# Patient Record
Sex: Male | Born: 1980 | Race: Black or African American | Hispanic: No | Marital: Single | Smoking: Never smoker
Health system: Southern US, Community
[De-identification: ages and names within clinical notes are randomized; demographics above are authoritative.]

## PROBLEM LIST (undated history)

## (undated) DIAGNOSIS — J45909 Unspecified asthma, uncomplicated: Secondary | ICD-10-CM

## (undated) DIAGNOSIS — S069XAA Unspecified intracranial injury with loss of consciousness status unknown, initial encounter: Secondary | ICD-10-CM

## (undated) DIAGNOSIS — S069X9A Unspecified intracranial injury with loss of consciousness of unspecified duration, initial encounter: Secondary | ICD-10-CM

---

## 2017-04-15 ENCOUNTER — Other Ambulatory Visit: Payer: Self-pay

## 2017-04-15 ENCOUNTER — Encounter (HOSPITAL_COMMUNITY): Payer: Self-pay | Admitting: Emergency Medicine

## 2017-04-15 ENCOUNTER — Emergency Department (HOSPITAL_COMMUNITY): Payer: PRIVATE HEALTH INSURANCE

## 2017-04-15 ENCOUNTER — Emergency Department (HOSPITAL_COMMUNITY)
Admission: EM | Admit: 2017-04-15 | Discharge: 2017-04-15 | Disposition: A | Discharge status: Home / Self Care | Payer: PRIVATE HEALTH INSURANCE | Attending: Emergency Medicine | Admitting: Emergency Medicine

## 2017-04-15 DIAGNOSIS — R05 Cough: Secondary | ICD-10-CM | POA: Diagnosis present

## 2017-04-15 DIAGNOSIS — J40 Bronchitis, not specified as acute or chronic: Secondary | ICD-10-CM

## 2017-04-15 DIAGNOSIS — R0789 Other chest pain: Secondary | ICD-10-CM | POA: Diagnosis not present

## 2017-04-15 DIAGNOSIS — Z87820 Personal history of traumatic brain injury: Secondary | ICD-10-CM | POA: Diagnosis not present

## 2017-04-15 HISTORY — DX: Unspecified intracranial injury with loss of consciousness status unknown, initial encounter: S06.9XAA

## 2017-04-15 HISTORY — DX: Unspecified asthma, uncomplicated: J45.909

## 2017-04-15 HISTORY — DX: Unspecified intracranial injury with loss of consciousness of unspecified duration, initial encounter: S06.9X9A

## 2017-04-15 LAB — BASIC METABOLIC PANEL
ANION GAP: 9 (ref 5–15)
BUN: 15 mg/dL (ref 6–20)
CHLORIDE: 101 mmol/L (ref 101–111)
CO2: 27 mmol/L (ref 22–32)
Calcium: 9.4 mg/dL (ref 8.9–10.3)
Creatinine, Ser: 0.84 mg/dL (ref 0.61–1.24)
GFR calc Af Amer: >60 mL/min (ref >60)
GLUCOSE: 108 mg/dL — AB (ref 65–99)
POTASSIUM: 3.9 mmol/L (ref 3.5–5.1)
Sodium: 137 mmol/L (ref 135–145)

## 2017-04-15 LAB — CBC
HEMATOCRIT: 43.2 % (ref 39.0–52.0)
HEMOGLOBIN: 15 g/dL (ref 13.0–17.0)
MCH: 31.3 pg (ref 26.0–34.0)
MCHC: 34.7 g/dL (ref 30.0–36.0)
MCV: 90.2 fL (ref 78.0–100.0)
Platelets: 259 10*3/uL (ref 150–400)
RBC: 4.79 MIL/uL (ref 4.22–5.81)
RDW: 13.7 % (ref 11.5–15.5)
WBC: 6.3 10*3/uL (ref 4.0–10.5)

## 2017-04-15 LAB — I-STAT TROPONIN, ED
Troponin i, poc: 0 ng/mL (ref 0.00–0.08)
Troponin i, poc: 0 ng/mL (ref 0.00–0.08)

## 2017-04-15 MED ORDER — ALBUTEROL SULFATE HFA 108 (90 BASE) MCG/ACT IN AERS: 1.0000 | INHALATION_SPRAY | Freq: Once | RESPIRATORY_TRACT | Status: DC

## 2017-04-15 MED ORDER — ALBUTEROL SULFATE HFA 108 (90 BASE) MCG/ACT IN AERS: 1.0000 | INHALATION_SPRAY | Freq: Four times a day (QID) | RESPIRATORY_TRACT | 0 refills | Status: AC | PRN

## 2017-04-15 MED ORDER — BENZONATATE 100 MG PO CAPS: 100.0000 mg | ORAL_CAPSULE | Freq: Three times a day (TID) | ORAL | 0 refills | Status: DC

## 2017-04-15 MED ORDER — IPRATROPIUM-ALBUTEROL 0.5-2.5 (3) MG/3ML IN SOLN
3.0000 mL | Freq: Once | RESPIRATORY_TRACT | Status: AC
  Administered 2017-04-15: 3 mL via RESPIRATORY_TRACT
  Filled 2017-04-15: qty 3

## 2017-04-15 MED ORDER — PREDNISONE 20 MG PO TABS: 40.0000 mg | ORAL_TABLET | Freq: Every day | ORAL | 0 refills | Status: DC

## 2017-04-15 MED ORDER — PREDNISONE 20 MG PO TABS
40.0000 mg | ORAL_TABLET | Freq: Once | ORAL | Status: AC
  Administered 2017-04-15: 40 mg via ORAL
  Filled 2017-04-15: qty 2

## 2017-04-15 MED ORDER — BENZONATATE 100 MG PO CAPS
100.0000 mg | ORAL_CAPSULE | Freq: Once | ORAL | Status: AC
  Administered 2017-04-15: 100 mg via ORAL
  Filled 2017-04-15: qty 1

## 2017-04-15 MED ORDER — IBUPROFEN 200 MG PO TABS
600.0000 mg | ORAL_TABLET | Freq: Once | ORAL | Status: AC
  Administered 2017-04-15: 600 mg via ORAL
  Filled 2017-04-15: qty 3

## 2017-04-15 NOTE — Discharge Instructions (Signed)
Your workup including lab work and imaging has been very reassuring.  You likely have a viral bronchitis.  Given your history of asthma have given you an inhaler to use.  Tessalon as needed for cough.  Take the prednisone starting tomorrow for the next 3 days.  Motrin and Tylenol at home for pain and fevers.  Please keep a close eye on your blood pressure.  Check this periodically.  Will need follow-up with a primary care doctor in the future if your blood pressures remain high for possible control.  If you develop any worsening chest pain, worsening shortness of breath, fevers or worsening productive cough return to the ED for further evaluation.

## 2017-04-15 NOTE — ED Triage Notes (Signed)
Pt reports having shortness of breath and chest pain for the last few days. Pt states pain in chest is sharp. Pt also reports productive cough for the last 4 days.

## 2017-04-15 NOTE — ED Notes (Signed)
Sa02 96% Lying/resting. Sa02 97% walking 60 seconds

## 2017-04-15 NOTE — ED Notes (Signed)
Sarah RN reported off Pt refused IV start.

## 2017-04-15 NOTE — ED Provider Notes (Signed)
[facility]-EMERGENCY DEPT Provider Note   CSN: [id number] Arrival date & time: 04/15/17  0524     History   Chief Complaint Chief Complaint  Patient presents with  . Shortness of Breath  . Chest Pain    HPI Darren Andrews is a 36 y.o. male.  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  The patient has a history of asthma.  States that for the past few days he has had a productive cough of yellow-green sputum.  Patient also states that over the past 2-3 days after started coughing he was having intermittent shortness of breath and chest wall pain with cough.  Patient describes the chest pain is substernal that is worse with cough and radiates across his chest.  Denies any associated diaphoresis, nausea, emesis.  Patient has no cardiac history.  Patient reports intermittent wheezing.  Denies any exertional chest pain or pleuritic chest pain.  Nothing makes his symptoms better.  He is tried over-the-counter NyQuil with only little relief.  Patient reports associated nasal congestion, rhinorrhea, sore throat.  Denies any associated fever, chills.  Patient denies any significant cardiac history or significant family cardiac history.  Denies any history of DVT/PE, prolonged immobilization, recent hospitalization/surgeries, unilateral leg swelling, hemoptysis, tobacco use.  Patient [place]  He does not have a PCP.  States that he ran out of his inhaler.  States this feels like his bronchitis or asthma exacerbation in the past.  Pt denies any fever, chill, ha, vision changes, lightheadedness, dizziness, congestion, neck pain, abd pain, n/v/d, urinary symptoms, change in bowel habits, melena, hematochezia, lower extremity  paresthesias. s Past Medical History:  Diagnosis Date  . Asthma   . TBI (traumatic brain injury) (HCC)     There are no active problems to display for this patient.   History reviewed. No pertinent surgical history.     Home Medications    Prior to Admission medications   Medication Sig Start Date End Date Taking? Authorizing Provider  naproxen sodium (ALEVE) 220 MG tablet Take 220 mg daily as needed by mouth (pain).   Yes [provider]  Phenyleph-Doxylamine-DM-APAP (NYQUIL SEVERE COLD/FLU) 5-6.25-10-325 MG/15ML LIQD Take 30 mLs as needed by mouth (cough).   Yes [provider]    Family History History reviewed. No pertinent family history.  Social History Social History   Tobacco Use  . Smoking status: Never Smoker  . Smokeless tobacco: Never Used  Substance Use Topics  . Alcohol use: No    Frequency: Never  . Drug use: No     Allergies   Patient has no known allergies.   Review of Systems Review of Systems  Constitutional: Negative for chills, diaphoresis and fever.  HENT: Positive for congestion, postnasal drip, rhinorrhea and sore throat.   Eyes: Negative for visual disturbance.  Respiratory: Positive for cough, chest tightness, shortness of breath and wheezing.   Cardiovascular: Positive for chest pain (chest wall). Negative for palpitations and leg swelling.  Gastrointestinal: Negative for abdominal pain, diarrhea, nausea and vomiting.  Genitourinary: Negative for dysuria, flank pain, frequency, hematuria and urgency.  Musculoskeletal: Negative for arthralgias and myalgias.  Skin: Negative for rash.  Neurological: Negative for dizziness, syncope, weakness, light-headedness, numbness and headaches.  Psychiatric/Behavioral: Negative for sleep disturbance. The patient is not nervous/anxious.  Physical Exam Updated Vital Signs BP (!) 142/86 (BP Location: Left Arm)   Pulse 88   Temp 97.9 F (36.6 C) (Oral)   Resp 18   Ht 5'  6" (1.676 m)   Wt (!) 158.8 kg (350 lb)   SpO2 100%   BMI 56.49 kg/m   Physical Exam  Constitutional: He is oriented to person, place, and time. He appears well-developed and well-nourished.  Non-toxic appearance. No distress.  HENT:  Head: Normocephalic and atraumatic.  Nose: Nose normal.  Mouth/Throat: Oropharynx is clear and moist.  Eyes: Conjunctivae are normal. Pupils are equal, round, and reactive to light. Right eye exhibits no discharge. Left eye exhibits no discharge.  Neck: Normal range of motion. Neck supple. No JVD present. No tracheal deviation present.  Cardiovascular: Normal rate, regular rhythm, normal heart sounds and intact distal pulses. Exam reveals no gallop and no friction rub.  No murmur heard. Pulmonary/Chest: Effort normal. No stridor. No respiratory distress. He has wheezes (scattered expiratory). He has no rales. He exhibits tenderness (mild anterior).  No hypoxia or tachypnea.  Abdominal: Soft. Bowel sounds are normal. He exhibits no distension. There is no tenderness. There is no rigidity, no rebound, no guarding, no CVA tenderness, no tenderness at McBurney's point and negative Murphy's sign.  Musculoskeletal: Normal range of motion.  No lower extremity edema or calf tenderness.  Lymphadenopathy:    He has no cervical adenopathy.  Neurological: He is alert and oriented to person, place, and time.  Skin: Skin is warm and dry. Capillary refill takes less than 2 seconds. He is not diaphoretic.  Psychiatric: His behavior is normal. Judgment and thought content normal.  Nursing note and vitals reviewed.    ED Treatments / Results  Labs (all labs ordered are listed, but only abnormal results are displayed) Labs Reviewed  BASIC METABOLIC PANEL - Abnormal; Notable for the following components:      Result Value   Glucose, Bld 108 (*)    All other components within normal limits  CBC  I-STAT TROPONIN, ED    EKG  EKG Interpretation None        Radiology Dg Chest 2 View  Result Date: 04/15/2017 CLINICAL DATA:  Acute onset chest pain and shortness of breath this morning. History of hypertension. EXAM: CHEST  2 VIEW COMPARISON:  None. FINDINGS: Cardiomediastinal silhouette is normal. No pleural effusions or focal consolidations. Bronchitic changes. Trachea projects midline and there is no pneumothorax. Soft tissue planes and included osseous structures are non-suspicious. Large body habitus. IMPRESSION: Bronchitic changes without focal consolidation. Electronically Signed   By: Awilda Metroourtnay  Bloomer M.D.   On: 04/15/2017 06:05    Procedures Procedures (including critical care time)  Medications Ordered in ED Medications  ipratropium-albuterol (DUONEB) 0.5-2.5 (3) MG/3ML nebulizer solution 3 mL (not administered)  predniSONE (DELTASONE) tablet 40 mg (40 mg Oral Given 04/15/17 0715)  ibuprofen (ADVIL,MOTRIN) tablet 600 mg (600 mg Oral Given 04/15/17 0715)  benzonatate (TESSALON) capsule 100 mg (100 mg Oral Given 04/15/17 0716)     Initial Impression / Assessment and Plan / ED Course  I have reviewed the triage vital signs and the nursing notes.  Pertinent labs & imaging results that were available during my care of the patient were reviewed by me and considered in my medical decision making (see chart for details).     Patient presents to the ED for evaluation of wheezing, productive cough, chest pain, shortness of breath.  Patient with history of asthma and is out  of his inhaler.  Patient describes the chest pain is substernal and worse with cough. Reports some chest tightness and wheezing.  On exam patient is overall well-appearing and nontoxic.  Vital signs are reassuring.  Patient is afebrile, no hypoxia, no tachypnea in the ED.  On exam patient does have some scattered expiratory wheezes noted.  No other lung abnormalities noted on exam.  Patient does have some mild anterior chest wall tenderness to palpation.  No lower  extremity edema or calf tenderness.  Abdominal exam was benign.  No focal abdominal tenderness.  Labs are reassuring.  No leukocytosis.  No electrolyte abnormality.  I-STAT delta troponins are negative.  EKG shows some borderline T wave abnormalities but no signs of acute ischemia.  Normal sinus rhythm.  Chest x-ray shows bronchitic changes.  Patient's presentation seems consistent with a viral bronchitis.  Chest pain seems related more to muscular skeletal pain from cough.  Shortness of breath likely due to to bronchitis with underlying asthma exacerbation. Perc negative. Heart pathway score is 2.  Presentation does not seem consistent with PE, dissection, ACS.    Patient did have some elevated blood pressures however this was automatic over patient's sweatshirt.  Patient also has contracture of his right arm at baseline due to traumatic brain injury.  I had the nursing staff do manual blood pressures bilaterally that were the same.  Mild elevation of hypertension with no history of same.  Will need close follow-up with PCP for further blood pressure management.   Patient vital signs remained stable.  He is able to ambulate in the ED with saturation above 95%.  Patient feels improved and ready for discharge at this time.  Encourage close follow-up.  Pt is hemodynamically stable, in NAD, & able to ambulate in the ED. Evaluation does not show pathology that would require ongoing emergent intervention or inpatient treatment. I explained the diagnosis to the patient. Pain has been managed & has no complaints prior to dc. Pt is comfortable with above plan and is stable for discharge at this time. All questions were answered prior to disposition. Strict return precautions for f/u to the ED were discussed. Encouraged follow up with PCP.  Final Clinical Impressions(s) / ED Diagnoses   Final diagnoses:  Bronchitis  Chest wall pain    ED Discharge Orders    None       Wallace Keller 04/15/17 4098    Geoffery Lyons, MD 04/16/17 [phone]

## 2017-04-15 NOTE — ED Notes (Signed)
Patient requesting straight stick for blood draw instead of IV, EDPA made aware.

## 2017-11-18 ENCOUNTER — Emergency Department (HOSPITAL_COMMUNITY): Payer: PRIVATE HEALTH INSURANCE

## 2017-11-18 ENCOUNTER — Emergency Department (HOSPITAL_COMMUNITY)
Admission: EM | Admit: 2017-11-18 | Discharge: 2017-11-18 | Disposition: A | Discharge status: Home / Self Care | Payer: PRIVATE HEALTH INSURANCE | Attending: Emergency Medicine | Admitting: Emergency Medicine

## 2017-11-18 ENCOUNTER — Encounter (HOSPITAL_COMMUNITY): Payer: Self-pay | Admitting: Emergency Medicine

## 2017-11-18 DIAGNOSIS — Z5321 Procedure and treatment not carried out due to patient leaving prior to being seen by health care provider: Secondary | ICD-10-CM | POA: Insufficient documentation

## 2017-11-18 DIAGNOSIS — R079 Chest pain, unspecified: Secondary | ICD-10-CM | POA: Insufficient documentation

## 2017-11-18 LAB — BASIC METABOLIC PANEL
Anion gap: 7 (ref 5–15)
BUN: 14 mg/dL (ref 6–20)
CALCIUM: 9.2 mg/dL (ref 8.9–10.3)
CO2: 28 mmol/L (ref 22–32)
CREATININE: 1.13 mg/dL (ref 0.61–1.24)
Chloride: 106 mmol/L (ref 101–111)
GFR calc Af Amer: >60 mL/min (ref >60)
GLUCOSE: 152 mg/dL — AB (ref 65–99)
Potassium: 3.5 mmol/L (ref 3.5–5.1)
SODIUM: 141 mmol/L (ref 135–145)

## 2017-11-18 LAB — CBC
HCT: 42 % (ref 39.0–52.0)
Hemoglobin: 14.3 g/dL (ref 13.0–17.0)
MCH: 31.6 pg (ref 26.0–34.0)
MCHC: 34 g/dL (ref 30.0–36.0)
MCV: 92.9 fL (ref 78.0–100.0)
PLATELETS: 242 10*3/uL (ref 150–400)
RBC: 4.52 MIL/uL (ref 4.22–5.81)
RDW: 14.7 % (ref 11.5–15.5)
WBC: 7.1 10*3/uL (ref 4.0–10.5)

## 2017-11-18 LAB — I-STAT TROPONIN, ED: TROPONIN I, POC: 0 ng/mL (ref 0.00–0.08)

## 2017-11-18 NOTE — ED Triage Notes (Signed)
Patient c/o intermittent sharp left side chest pain radiating to right side since this morning with SOB. Denies N/V.

## 2018-11-30 IMAGING — CR DG CHEST 2V
2 series · 2 of 2 positions shown · non-contrast
Comparison: 04/15/2017

CLINICAL DATA: Intermittent sharp left-sided chest pain radiating
to right side since this morning which shortness-of-breath.

EXAM:
CHEST - 2 VIEW

[w chest pa]
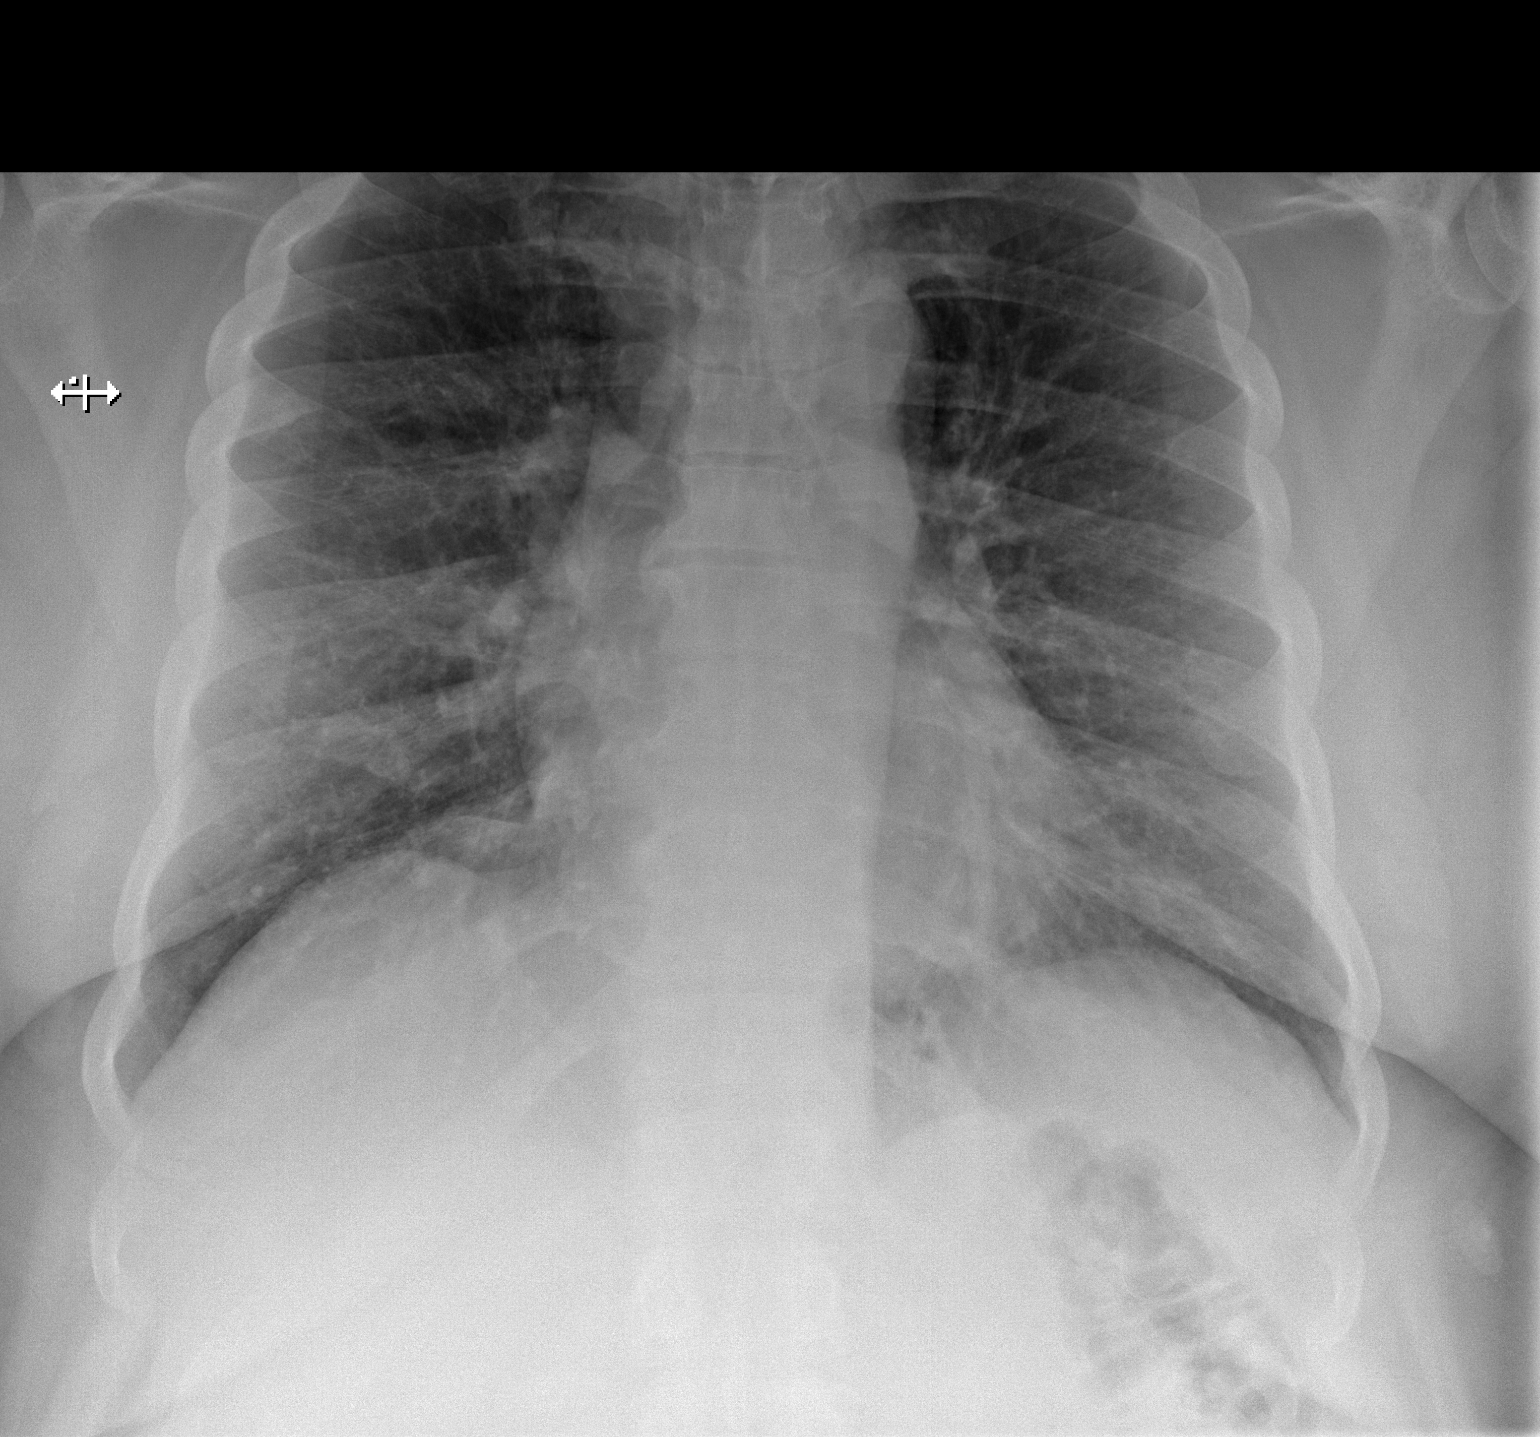

[w chest lat]
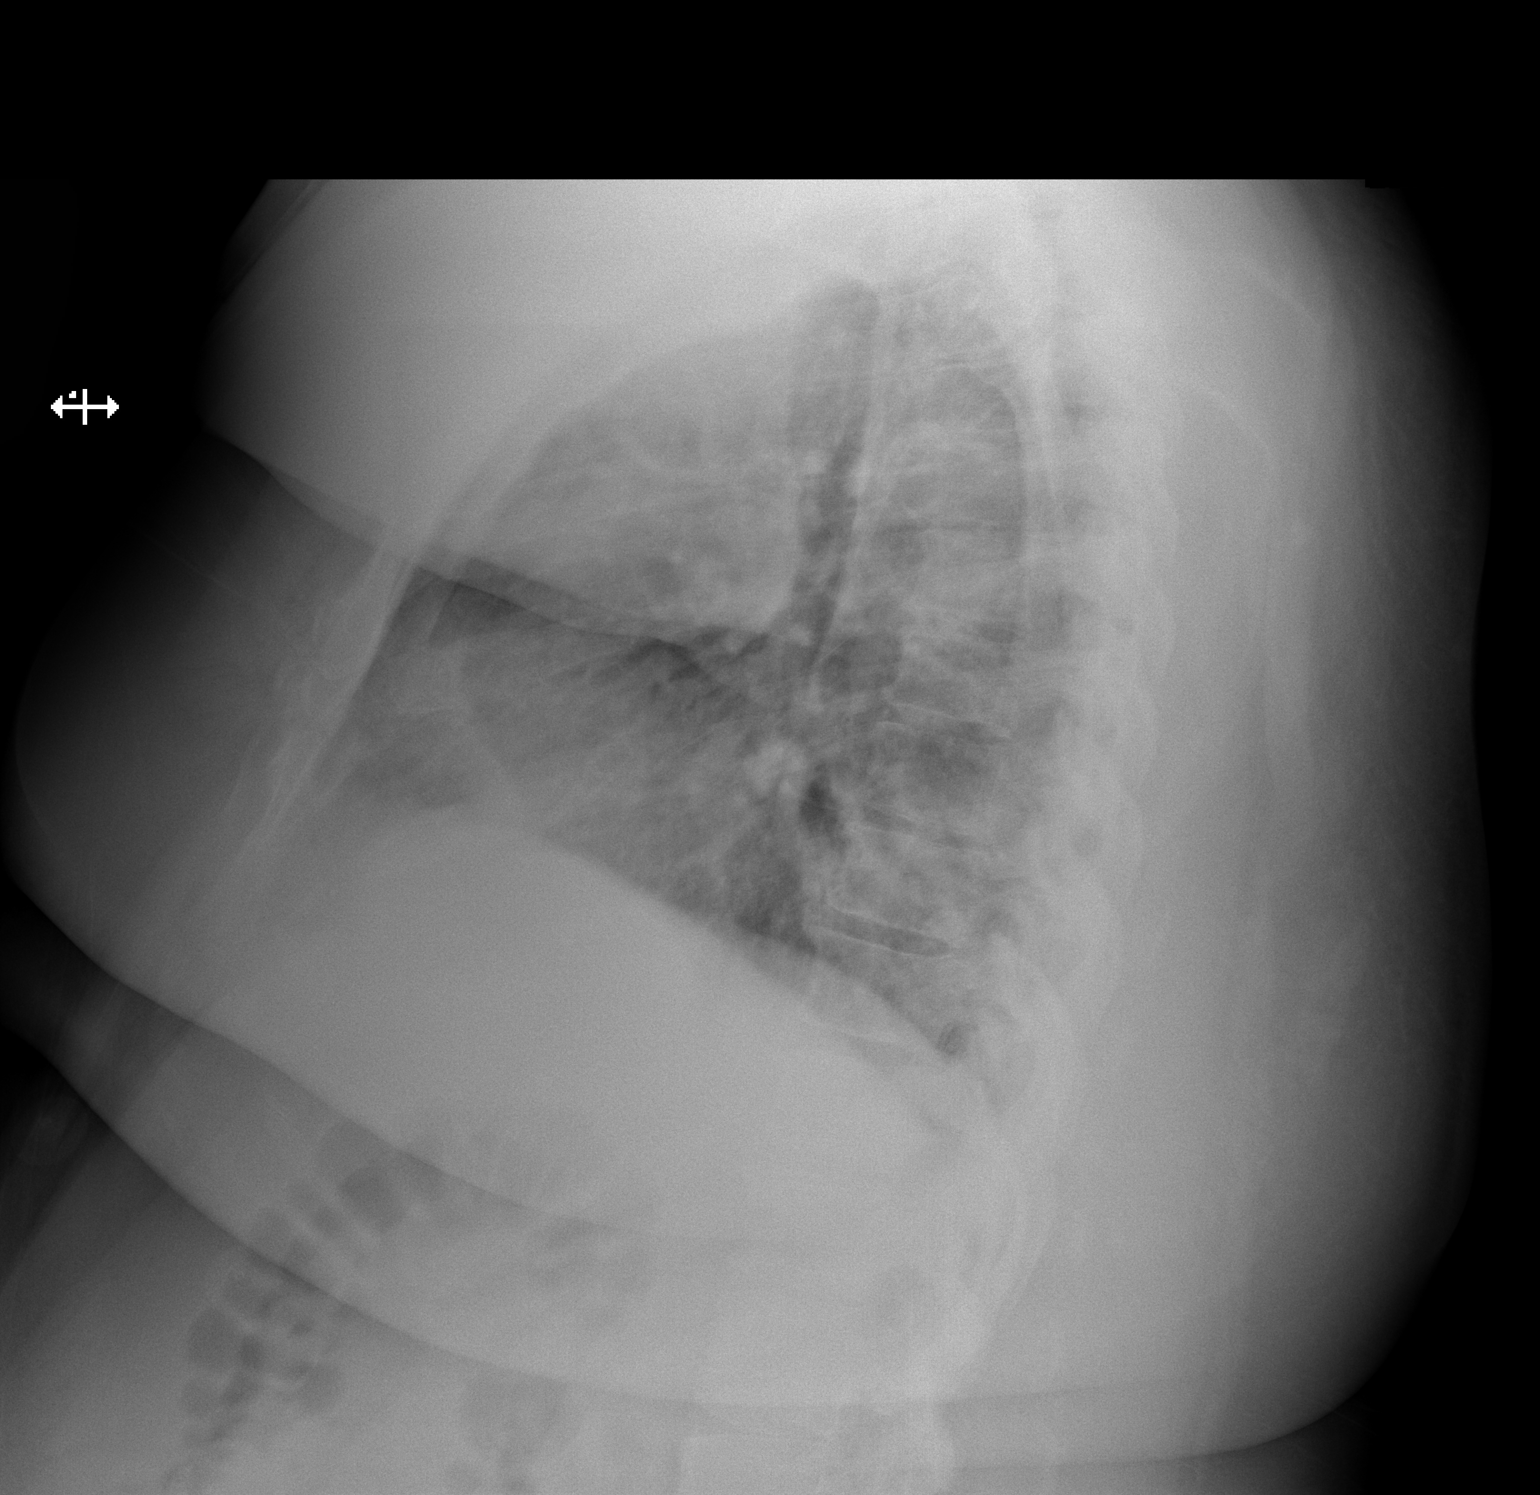

[2 of 2 positions shown; findings below may reference images not displayed]

FINDINGS: Lungs are adequately inflated without evidence of effusion. There is
mild hazy opacification over the posterior lung bases on the lateral
film which may represent developing infection. Cardiomediastinal
silhouette and remainder of the exam is unchanged.
IMPRESSION: Mild hazy opacification over the posterior lung bases on the lateral
film which may represent developing infection.

## 2019-12-27 ENCOUNTER — Ambulatory Visit (HOSPITAL_BASED_OUTPATIENT_CLINIC_OR_DEPARTMENT_OTHER): Admit: 2019-12-27 | Payer: PRIVATE HEALTH INSURANCE | Admitting: Otolaryngology

## 2019-12-27 ENCOUNTER — Encounter (HOSPITAL_BASED_OUTPATIENT_CLINIC_OR_DEPARTMENT_OTHER): Payer: Self-pay

## 2019-12-27 SURGERY — SINUS SURGERY, ENDOSCOPIC
Anesthesia: General | Laterality: Bilateral

## 2020-10-01 ENCOUNTER — Encounter (HOSPITAL_COMMUNITY): Payer: Self-pay | Admitting: Emergency Medicine

## 2020-10-01 ENCOUNTER — Other Ambulatory Visit: Payer: Self-pay

## 2020-10-01 ENCOUNTER — Emergency Department (HOSPITAL_COMMUNITY)
Admission: EM | Admit: 2020-10-01 | Discharge: 2020-10-01 | Disposition: A | Discharge status: Home / Self Care | Payer: BLUE CROSS/BLUE SHIELD | Attending: Emergency Medicine | Admitting: Emergency Medicine

## 2020-10-01 ENCOUNTER — Emergency Department (HOSPITAL_COMMUNITY): Payer: BLUE CROSS/BLUE SHIELD

## 2020-10-01 DIAGNOSIS — M5431 Sciatica, right side: Secondary | ICD-10-CM | POA: Diagnosis not present

## 2020-10-01 DIAGNOSIS — J45909 Unspecified asthma, uncomplicated: Secondary | ICD-10-CM | POA: Diagnosis not present

## 2020-10-01 DIAGNOSIS — M25551 Pain in right hip: Secondary | ICD-10-CM | POA: Diagnosis present

## 2020-10-01 MED ORDER — PREDNISONE 20 MG PO TABS
20.0000 mg | ORAL_TABLET | Freq: Every day | ORAL | 0 refills | Status: AC
Start: 2020-10-02 — End: 2020-10-07

## 2020-10-01 MED ORDER — PREDNISONE 20 MG PO TABS
60.0000 mg | ORAL_TABLET | Freq: Once | ORAL | Status: AC
  Administered 2020-10-01: 60 mg via ORAL
  Filled 2020-10-01: qty 3

## 2020-10-01 MED ORDER — KETOROLAC TROMETHAMINE 15 MG/ML IJ SOLN
15.0000 mg | Freq: Once | INTRAMUSCULAR | Status: AC
  Administered 2020-10-01: 15 mg via INTRAMUSCULAR
  Filled 2020-10-01: qty 1

## 2020-10-01 NOTE — ED Triage Notes (Signed)
Patient c/o right hip pain that radiates to knee since yesterday. He reports he was walking upstairs when he noticed the pain. Reports taking tylenol and aleve w/ no relief. Patient reports it is difficult to bear weight on the leg

## 2020-10-01 NOTE — Discharge Instructions (Addendum)
-  Prescription sent to pharmacy for prednisone. This is to help with pain and inflammation.   Follow up with on call orthopedics Dr. Linna Caprice if your hip pain does not get better.  Continue tylenol and motrin for pain.  Return to ER for new or worsening symptoms.

## 2020-10-01 NOTE — Progress Notes (Signed)
Orthopedic Tech Progress Note Patient Details:  Darren Andrews Nov 07, 1980 [id number]  Ortho Devices Type of Ortho Device: Crutches Ortho Device/Splint Interventions: Ordered,Adjustment   Post Interventions Patient Tolerated: Well Instructions Provided: Adjustment of device   Maurene Capes 10/01/2020, 4:25 PM

## 2020-10-01 NOTE — ED Provider Notes (Signed)
[facility]-EMERGENCY DEPT Provider Note   CSN: [id number] Arrival date & time: 10/01/20  1228     History Chief Complaint  Patient presents with  . Hip Pain    Darren Andrews is a 40 y.o. male with past medical history significant for asthma and TBI.   HPI Patient presents to emergency room today with chief complaint of right hip pain x1 day.  Pain started after walking up stairs.  Tried taking Tylenol and Aleve without any symptom relief.  Patient has difficulty bearing weight on right leg because of the pain. He describes the pain as sharp and shooting.  It is intermittent.  He rates pain currently 6 out of 10 in severity.  He denies any numbness or tingling.  Denies any fall or injury.    Past Medical History:  Diagnosis Date  . Asthma   . TBI (traumatic brain injury) (HCC)     There are no problems to display for this patient.   History reviewed. No pertinent surgical history.     History reviewed. No pertinent family history.  Social History   Tobacco Use  . Smoking status: Never Smoker  . Smokeless tobacco: Never Used  Substance Use Topics  . Alcohol use: No  . Drug use: No    Home Medications Prior to Admission medications   Medication Sig Start Date End Date Taking? Authorizing Provider  predniSONE (DELTASONE) 20 MG tablet Take 1 tablet (20 mg total) by mouth daily for 5 days. 10/02/20 10/07/20 Yes Walisiewicz, Xavier Fournier E, PA-C  albuterol (PROVENTIL HFA;VENTOLIN HFA) 108 (90 Base) MCG/ACT inhaler Inhale 1-2 puffs every 6 (six) hours as needed into the lungs for wheezing or shortness of breath. 04/15/17   Rise Mu, PA-C  benzonatate (TESSALON) 100 MG capsule Take 1 capsule (100 mg total) every 8 (eight) hours by mouth. 04/15/17   Leaphart, Lynann Beaver, PA-C  naproxen sodium (ALEVE) 220 MG tablet Take 220 mg daily as needed by mouth (pain).    [provider]  Phenyleph-Doxylamine-DM-APAP (NYQUIL SEVERE COLD/FLU) 5-6.25-10-325  MG/15ML LIQD Take 30 mLs as needed by mouth (cough).    [provider]    Allergies    Patient has no known allergies.  Review of Systems   Review of Systems All other systems are reviewed and are negative for acute change except as noted in the HPI.  Physical Exam Updated Vital Signs BP (!) 153/97 (BP Location: Right Arm)   Pulse 85   Temp 98.7 F (37.1 C) (Oral)   Resp 18   SpO2 95%   Physical Exam Vitals and nursing note reviewed.  Constitutional:      Appearance: He is well-developed. He is not ill-appearing or toxic-appearing.  HENT:     Head: Normocephalic and atraumatic.     Nose: Nose normal.  Eyes:     General: No scleral icterus.       Right eye: No discharge.        Left eye: No discharge.     Conjunctiva/sclera: Conjunctivae normal.  Neck:     Vascular: No JVD.  Cardiovascular:     Rate and Rhythm: Normal rate and regular rhythm.     Pulses: Normal pulses.     Heart sounds: Normal heart sounds.  Pulmonary:     Effort: Pulmonary effort is normal.     Breath sounds: Normal breath sounds.  Abdominal:     General: There is no distension.     Palpations: There is no  mass.     Hernia: No hernia is present.  Musculoskeletal:     Cervical back: Normal range of motion.     Comments: Tender to palpation of right hip with deep palpation.  No overlying skin changes.  Full range of motion of right hip pain present.  No swelling or deformity.  Neurovascular intact distally.  Compartments are soft and right lower extremity.  Ambulatory with antalgic gait.  Skin:    General: Skin is warm and dry.  Neurological:     Mental Status: He is oriented to person, place, and time.     GCS: GCS eye subscore is 4. GCS verbal subscore is 5. GCS motor subscore is 6.     Comments: Sensation grossly intact to light touch in the lower extremities bilaterally. No saddle anesthesias. Strength 5/5 with flexion and extension at the bilateral hips, knees, and  ankles. Coordination intact with heel to shin testing.  Psychiatric:        Behavior: Behavior normal.     ED Results / Procedures / Treatments   Labs (all labs ordered are listed, but only abnormal results are displayed) Labs Reviewed - No data to display  EKG None  Radiology DG Hip Unilat W or Wo Pelvis 2-3 Views Right  Result Date: 10/01/2020 CLINICAL DATA:  Pain. EXAM: DG HIP (WITH OR WITHOUT PELVIS) 2-3V RIGHT COMPARISON:  None. FINDINGS: There is no evidence of hip fracture or dislocation. There is no evidence of arthropathy or other focal bone abnormality. IMPRESSION: Negative. Electronically Signed   By: Gerome Sam III M.D   On: 10/01/2020 14:15    Procedures Procedures   Medications Ordered in ED Medications  ketorolac (TORADOL) 15 MG/ML injection 15 mg (has no administration in time range)  predniSONE (DELTASONE) tablet 60 mg (has no administration in time range)    ED Course  I have reviewed the triage vital signs and the nursing notes.  Pertinent labs & imaging results that were available during my care of the patient were reviewed by me and considered in my medical decision making (see chart for details).    MDM Rules/Calculators/A&P                          History provided by patient with additional history obtained from chart review.    Normal neurological exam, no evidence of urinary incontinence or retention, pain is consistently reproducible.  X-ray of right hip is negative for fracture or dislocation.  There is no evidence of AAA or concern for dissection at this time.   Patient can walk but states is painful.  No loss of bowel or bladder control.  No concern for cauda equina.  No fever, night sweats, weight loss, h/o cancer, IVDU.  Pain treated here in the department with adequate improvement.  Patient had labs x8 months ago that showed normal kidney function therefore low-dose IM Toradol given.Marland Kitchen  RICE protocol and pain medicine indicated and  discussed with patient. I have also discussed reasons to return immediately to the ER.  Patient expresses understanding and agrees with plan.    Portions of this note were generated with Scientist, clinical (histocompatibility and immunogenetics). Dictation errors may occur despite best attempts at proofreading.   Final Clinical Impression(s) / ED Diagnoses Final diagnoses:  Sciatica of right side    Rx / DC Orders ED Discharge Orders         Ordered    predniSONE (DELTASONE) 20 MG tablet  Daily        10/01/20 1506           Shanon Ace, PA-C 10/01/20 1513    Lorre Nick, MD 10/03/20 1012

## 2020-10-01 NOTE — ED Notes (Signed)
Ortho called for crutches 

## 2024-05-20 ENCOUNTER — Ambulatory Visit: Payer: PRIVATE HEALTH INSURANCE

## 2024-05-20 VITALS — BP 153/113 | HR 86 | Temp 97.5°F | Resp 18 | Ht 69.0 in | Wt 363.0 lb

## 2024-05-20 DIAGNOSIS — Z7689 Persons encountering health services in other specified circumstances: Secondary | ICD-10-CM

## 2024-05-20 DIAGNOSIS — R030 Elevated blood-pressure reading, without diagnosis of hypertension: Secondary | ICD-10-CM

## 2024-05-20 DIAGNOSIS — Z6841 Body Mass Index (BMI) 40.0 and over, adult: Secondary | ICD-10-CM | POA: Diagnosis not present

## 2024-05-20 DIAGNOSIS — J339 Nasal polyp, unspecified: Secondary | ICD-10-CM

## 2024-05-20 NOTE — Progress Notes (Signed)
 Patient ID: Darren Andrews, male    DOB: 15-Mar-1981  MRN: [id number]  CC: Establish Care (Nasal polyps, weight managment)   Subjective: Darren Andrews is a 43 y.o. male with past medical history of obesity who presents to clinic to establish care.  Patient reports being interested in assistance with weight loss with medication .  Patient reports he was previously on Zepbound for about 8 months and lost about 40 pounds in that time span .  Went from 380lbs to 339 lbs. reports he had a change in insurance and could no longer afford medication.  Additionally patient reports history of nasal polyps that required surgical management.  Patient reports that he was lost to follow-up with ENT and nasal polyps returned.  Currently experiencing symptoms including stuffiness and increase nasal drainage.  Denies fever, recent illness, or sick contact.   Allergies[1]  ROS: Review of Systems Negative except as stated above  PHYSICAL EXAM: BP (!) 153/113   Pulse 86   Temp (!) 97.5 F (36.4 C)   Resp 18   Ht 5' 9 (1.753 m)   Wt (!) 363 lb (164.7 kg)   SpO2 90%   BMI 53.61 kg/m   Physical Exam  General: well-appearing, no acute distress,  ENT: Nasal polyp visible on right nostril, audible breathing noises suggesting nasal congestion Cardiovascular: regular heart rate and rhythm, normal S1/S2, no murmurs, gallops, or rubs, peripheral pulses 2+ bilaterally Chest: no skeletal deformity, lungs clear to auscultation bilaterally, equal breath sounds bilaterally Musculoskeletal: normal gait Extremities: no peripheral edema  ASSESSMENT AND PLAN:  1. Encounter to establish care with new provider (Primary)  2. Morbid obesity (HCC) - The patient will adhere to a reduced calorie diet of 1800 calories per day. The patient will continue lifestyle modifications including healthy diet and 150 minutes of moderate intensity exercise per week.   - Liraglutide -Weight Management (SAXENDA) 18 MG/3ML SOPN;  Inject 0.6 mg into the skin daily for 7 days, THEN 1.2 mg daily for 7 days, THEN 1.8 mg daily for 7 days, THEN 2.4 mg daily for 7 days.  Dispense: 9 mL; Refill: 0 - Discussed medication side effects and method of action of medication. - Discussed importance of maintaining a exercise routine and eating a healthier diet for optimal weight loss  3. Nasal polyps - Patient will inquire about scheduling appointment with prior ENT provider.  - methylPREDNISolone (MEDROL DOSEPAK) 4 MG TBPK tablet; Follow instructions listed on box.  Dispense: 21 each; Refill: 0   4. Elevated blood pressure reading - BP: 153/113. Elevated - Discussed risk of prolonged elevated blood pressure with patient. - Will consider addition of blood pressure medication if blood pressure continues to be elevated next visit - Patient advised that weight loss will assist with reducing blood pressure   Patient was given the opportunity to ask questions.  Patient verbalized understanding of the plan and was able to repeat key elements of the plan.    No orders of the defined types were placed in this encounter.    Requested Prescriptions   Signed Prescriptions Disp Refills   methylPREDNISolone (MEDROL DOSEPAK) 4 MG TBPK tablet 21 each 0    Sig: Follow instructions listed on box.   Liraglutide -Weight Management (SAXENDA) 18 MG/3ML SOPN 9 mL 0    Sig: Inject 0.6 mg into the skin daily for 7 days, THEN 1.2 mg daily for 7 days, THEN 1.8 mg daily for 7 days, THEN 2.4 mg daily for 7 days.  Return in about 1 month (around 06/20/2024) for follow-up, physical.  Sula Leavy Rode, PA-C       [1] No Known Allergies
